# Patient Record
Sex: Female | Born: 1950 | Race: White | Hispanic: No | Marital: Married | Smoking: Never smoker
Health system: Southern US, Community
[De-identification: ages and names within clinical notes are randomized; demographics above are authoritative.]

## PROBLEM LIST (undated history)

## (undated) DIAGNOSIS — E7800 Pure hypercholesterolemia, unspecified: Secondary | ICD-10-CM

## (undated) DIAGNOSIS — I10 Essential (primary) hypertension: Secondary | ICD-10-CM

## (undated) DIAGNOSIS — K219 Gastro-esophageal reflux disease without esophagitis: Secondary | ICD-10-CM

---

## 2016-08-24 ENCOUNTER — Emergency Department (HOSPITAL_COMMUNITY): Payer: PRIVATE HEALTH INSURANCE

## 2016-08-24 ENCOUNTER — Encounter (HOSPITAL_COMMUNITY): Payer: Self-pay | Admitting: *Deleted

## 2016-08-24 ENCOUNTER — Emergency Department (HOSPITAL_COMMUNITY)
Admission: EM | Admit: 2016-08-24 | Discharge: 2016-08-24 | Disposition: A | Discharge status: Home / Self Care | Payer: PRIVATE HEALTH INSURANCE | Attending: Emergency Medicine | Admitting: Emergency Medicine

## 2016-08-24 DIAGNOSIS — I10 Essential (primary) hypertension: Secondary | ICD-10-CM | POA: Diagnosis not present

## 2016-08-24 DIAGNOSIS — Z7982 Long term (current) use of aspirin: Secondary | ICD-10-CM | POA: Diagnosis not present

## 2016-08-24 DIAGNOSIS — R079 Chest pain, unspecified: Secondary | ICD-10-CM | POA: Insufficient documentation

## 2016-08-24 DIAGNOSIS — Z79899 Other long term (current) drug therapy: Secondary | ICD-10-CM | POA: Insufficient documentation

## 2016-08-24 HISTORY — DX: Essential (primary) hypertension: I10

## 2016-08-24 HISTORY — DX: Gastro-esophageal reflux disease without esophagitis: K21.9

## 2016-08-24 HISTORY — DX: Pure hypercholesterolemia, unspecified: E78.00

## 2016-08-24 LAB — CBC
HEMATOCRIT: 42.8 % (ref 36.0–46.0)
HEMOGLOBIN: 14.8 g/dL (ref 12.0–15.0)
MCH: 33.6 pg (ref 26.0–34.0)
MCHC: 34.6 g/dL (ref 30.0–36.0)
MCV: 97.1 fL (ref 78.0–100.0)
Platelets: 308 10*3/uL (ref 150–400)
RBC: 4.41 MIL/uL (ref 3.87–5.11)
RDW: 12.2 % (ref 11.5–15.5)
WBC: 11.5 10*3/uL — AB (ref 4.0–10.5)

## 2016-08-24 LAB — BASIC METABOLIC PANEL
ANION GAP: 13 (ref 5–15)
BUN: 12 mg/dL (ref 6–20)
CO2: 29 mmol/L (ref 22–32)
Calcium: 10.7 mg/dL — ABNORMAL HIGH (ref 8.9–10.3)
Chloride: 93 mmol/L — ABNORMAL LOW (ref 101–111)
Creatinine, Ser: 0.75 mg/dL (ref 0.44–1.00)
GFR calc Af Amer: >60 mL/min (ref >60)
GLUCOSE: 115 mg/dL — AB (ref 65–99)
POTASSIUM: 3.6 mmol/L (ref 3.5–5.1)
Sodium: 135 mmol/L (ref 135–145)

## 2016-08-24 LAB — HEPATIC FUNCTION PANEL
ALBUMIN: 4 g/dL (ref 3.5–5.0)
ALT: 46 U/L (ref 14–54)
AST: 28 U/L (ref 15–41)
Alkaline Phosphatase: 104 U/L (ref 38–126)
Bilirubin, Direct: 0.2 mg/dL (ref 0.1–0.5)
Indirect Bilirubin: 0.9 mg/dL (ref 0.3–0.9)
TOTAL PROTEIN: 7.4 g/dL (ref 6.5–8.1)
Total Bilirubin: 1.1 mg/dL (ref 0.3–1.2)

## 2016-08-24 LAB — I-STAT TROPONIN, ED: Troponin i, poc: 0.01 ng/mL (ref 0.00–0.08)

## 2016-08-24 LAB — LIPASE, BLOOD: Lipase: 42 U/L (ref 11–51)

## 2016-08-24 MED ORDER — GI COCKTAIL ~~LOC~~
30.0000 mL | Freq: Once | ORAL | Status: AC
  Administered 2016-08-24: 30 mL via ORAL
  Filled 2016-08-24: qty 30

## 2016-08-24 NOTE — ED Triage Notes (Signed)
Pt reports episode today of nausea and mid chest/epigastric pain that radiates through to her back. Denies sob. Also reports fever today. Denies recent cough. ekg done at triage. No acute distress noted.

## 2016-08-24 NOTE — ED Provider Notes (Signed)
[facility]-EMERGENCY DEPT Provider Note   CSN: [id number] Arrival date & time: 08/24/16  1851     History   Chief Complaint Chief Complaint  Patient presents with  . Chest Pain  . Back Pain    HPI Amanda Neal is a 65 y.o. female.  Patient reports lower sternal pain with radiation to the back since early this morning with low-grade fever. Dyspnea, diaphoresis, nausea, cough, flulike symptoms. Past medical history includes hypertension and hypercholesterolemia. She has a positive family history for coronary artery disease. Severity of symptoms is mild to moderate. Nothing makes symptoms better or worse.      Past Medical History:  Diagnosis Date  . Acid reflux   . High cholesterol   . Hypertension     There are no active problems to display for this patient.   Past Surgical History:  Procedure Laterality Date  . CESAREAN SECTION      OB History    No data available       Home Medications    Prior to Admission medications   Medication Sig Start Date End Date Taking? Authorizing Provider  aspirin EC 81 MG tablet Take 81 mg by mouth daily.   Yes Historical Provider, MD  Calcium Carb-Cholecalciferol (CALCIUM 600-D PO) Take 600 mg by mouth daily.   Yes Historical Provider, MD  Coenzyme Q10 (COQ10) 200 MG CAPS Take 200 mg by mouth daily.   Yes Historical Provider, MD  esomeprazole (NEXIUM) 20 MG capsule Take 20 mg by mouth daily at 12 noon.   Yes Historical Provider, MD  hydrochlorothiazide (HYDRODIURIL) 25 MG tablet Take 25 mg by mouth daily. 07/04/16  Yes Historical Provider, MD  irbesartan (AVAPRO) 300 MG tablet Take 300 mg by mouth at bedtime. 07/04/16  Yes Historical Provider, MD  metoprolol succinate (TOPROL-XL) 25 MG 24 hr tablet Take 25 mg by mouth daily. 07/04/16  Yes Historical Provider, MD  Multiple Vitamin (MULTIVITAMIN WITH MINERALS) TABS tablet Take 1 tablet by mouth daily.   Yes Historical Provider, MD  Tetrahydrozoline HCl (VISINE OP) Place 1 drop into both  eyes daily.   Yes Historical Provider, MD    Family History History reviewed. No pertinent family history.  Social History Social History  Substance Use Topics  . Smoking status: Never Smoker  . Smokeless tobacco: Not on file  . Alcohol use Yes     Comment: daily cocktail     Allergies   Ivp dye [iodinated diagnostic agents] and Statins   Review of Systems Review of Systems  All other systems reviewed and are negative.    Physical Exam Updated Vital Signs BP 176/86   Pulse 69   Temp 98.6 F (37 C) (Oral)   Resp 16   Ht 4\' 10"  (1.473 m)   Wt 150 lb (68 kg)   SpO2 100%   BMI 31.35 kg/m   Physical Exam  Constitutional: She is oriented to person, place, and time. She appears well-developed and well-nourished.  HENT:  Head: Normocephalic and atraumatic.  Eyes: Conjunctivae are normal.  Neck: Neck supple.  Cardiovascular: Normal rate and regular rhythm.   Pulmonary/Chest: Effort normal and breath sounds normal.  Abdominal: Soft. Bowel sounds are normal.  Musculoskeletal: Normal range of motion.  Neurological: She is alert and oriented to person, place, and time.  Skin: Skin is warm and dry.  Psychiatric: She has a normal mood and affect. Her behavior is normal.  Nursing note and vitals reviewed.    ED Treatments / Results  Labs (all labs ordered are listed, but only abnormal results are displayed) Labs Reviewed  BASIC METABOLIC PANEL - Abnormal; Notable for the following:       Result Value   Chloride 93 (*)    Glucose, Bld 115 (*)    Calcium 10.7 (*)    All other components within normal limits  CBC - Abnormal; Notable for the following:    WBC 11.5 (*)    All other components within normal limits  HEPATIC FUNCTION PANEL  LIPASE, BLOOD  I-STAT TROPOININ, ED    EKG  EKG Interpretation  Date/Time:  Sunday August 24 2016 18:55:14 EST Ventricular Rate:  77 PR Interval:  160 QRS Duration: 80 QT Interval:  366 QTC Calculation: 414 R  Axis:   -8 Text Interpretation:  Normal sinus rhythm Possible Anterior infarct , age undetermined ST & T wave abnormality, consider lateral ischemia Abnormal ECG Confirmed by Adriana SimasOOK  MD, Makhia Vosler ([id number]) on 08/24/2016 8:06:45 PM       Radiology Dg Chest 2 View  Result Date: 08/24/2016 CLINICAL DATA:  Nausea and mid chest/epigastric pain. EXAM: CHEST  2 VIEW COMPARISON:  None. FINDINGS: Cardiomediastinal silhouette is normal. Mediastinal contours appear intact. Tortuosity and mild atherosclerotic disease of the aorta noted. There is no evidence of focal airspace consolidation, pleural effusion or pneumothorax. Osseous structures are without acute abnormality. Soft tissues are grossly normal. IMPRESSION: No active cardiopulmonary disease. Electronically Signed   By: Ted Mcalpineobrinka  Dimitrova M.D.   On: 08/24/2016 19:58    Procedures Procedures (including critical care time)  Medications Ordered in ED Medications  gi cocktail (Maalox,Lidocaine,Donnatal) (30 mLs Oral Given 08/24/16 1938)     Initial Impression / Assessment and Plan / ED Course  I have reviewed the triage vital signs and the nursing notes.  Pertinent labs & imaging results that were available during my care of the patient were reviewed by me and considered in my medical decision making (see chart for details).  Clinical Course     A shunt is hemodynamically stable. Screening tests including EKG, chest x-ray, troponin, basic labs all negative. I recommended she get cardiology follow-up at home.  Final Clinical Impressions(s) / ED Diagnoses   Final diagnoses:  Chest pain, unspecified type    New Prescriptions New Prescriptions   No medications on file     Donnetta HutchingBrian Alvera Tourigny, MD 08/24/16 [id number]

## 2016-08-24 NOTE — ED Notes (Signed)
Pt back from X-ray.  

## 2016-08-24 NOTE — ED Notes (Signed)
Patient transported to X-ray 

## 2016-08-24 NOTE — Discharge Instructions (Signed)
Screening tests including chest x-ray, EKG, troponin, chemistry panel, CBC, lipase all normal. I recommend that she see a cardiologist when you get home. I would double your reflux medicine for the time being

## 2018-05-25 IMAGING — CR DG CHEST 2V
2 series · 2 of 2 positions shown · non-contrast
Comparison: None.

CLINICAL DATA: Nausea and mid chest/epigastric pain.

EXAM:
CHEST  2 VIEW

[chest pa]
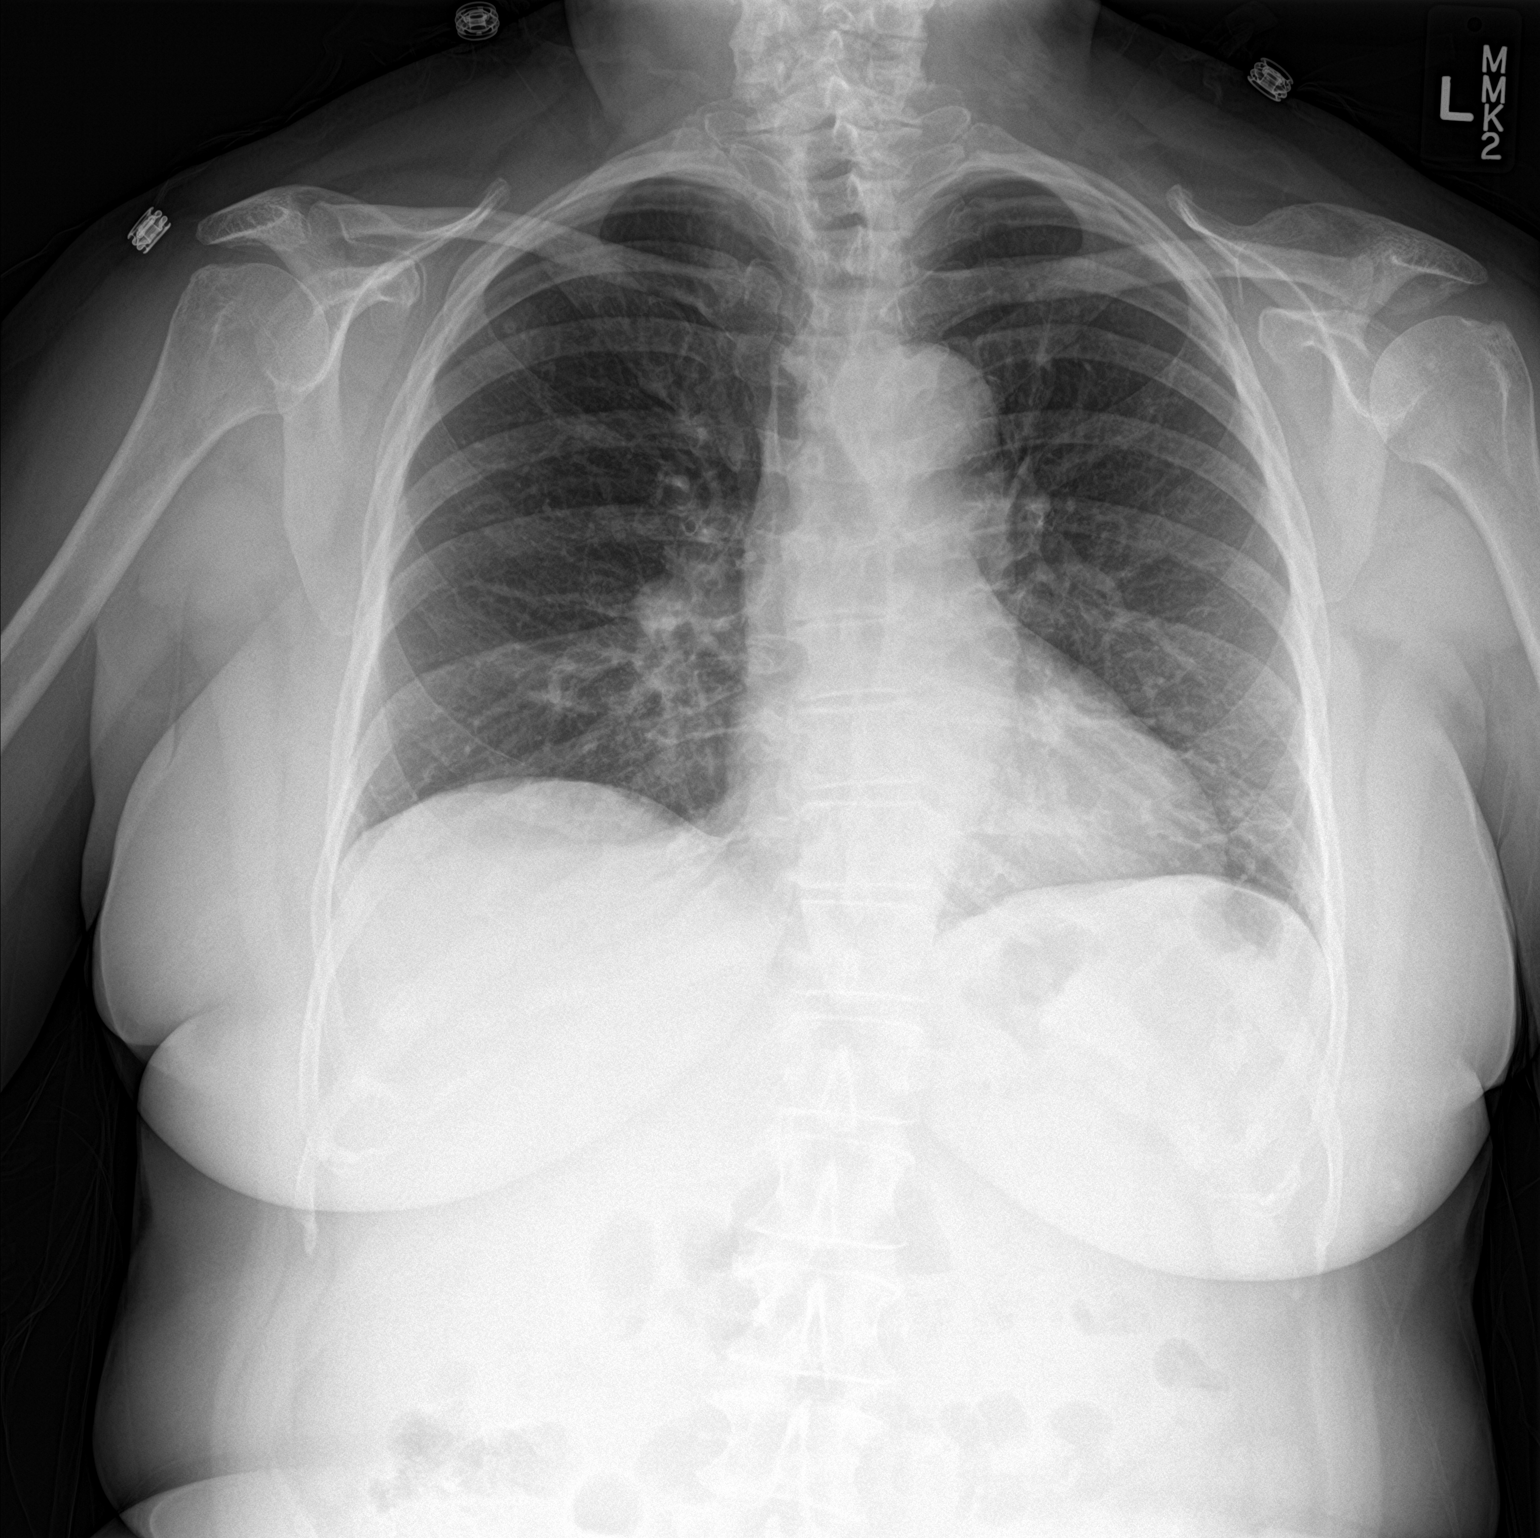

[chest lat]
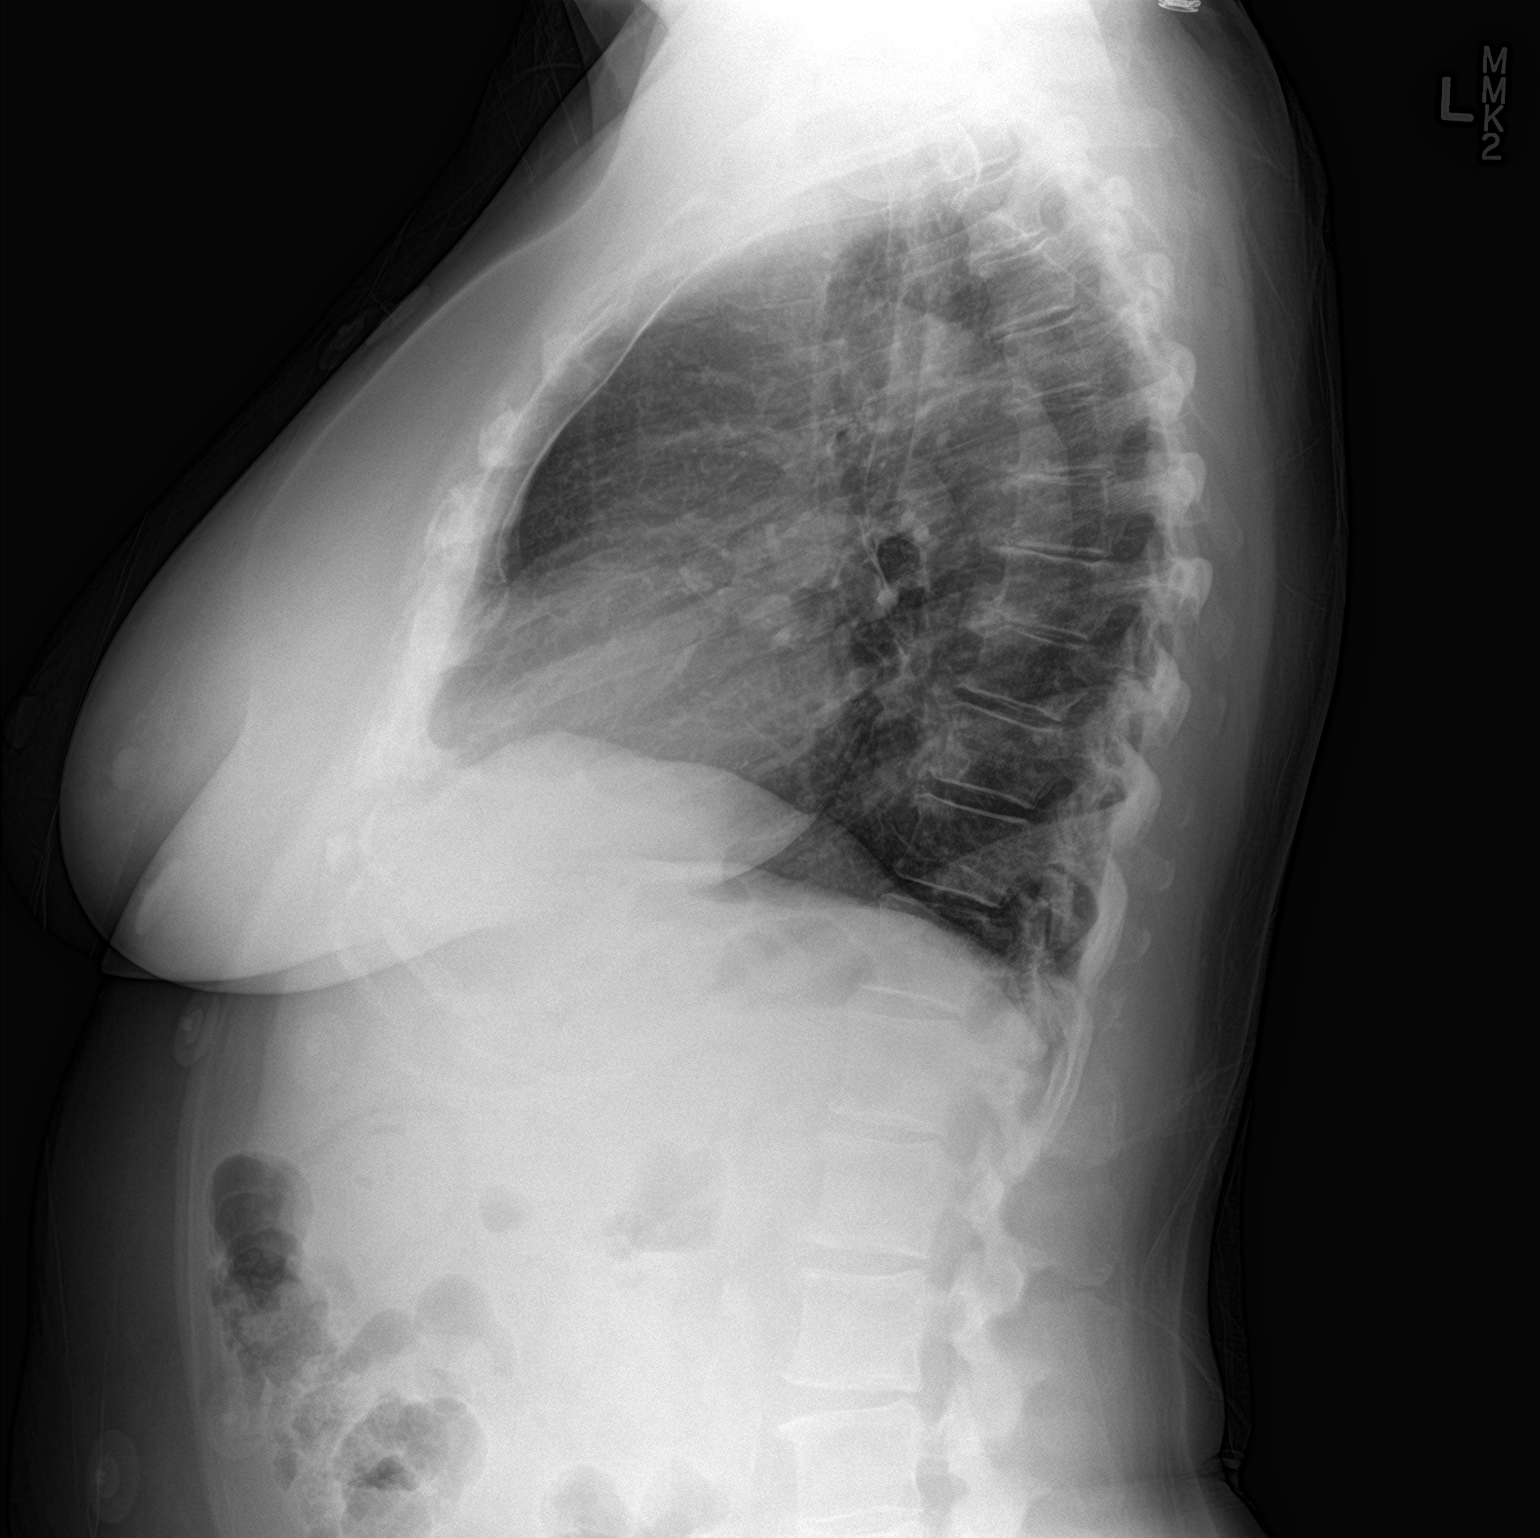

[2 of 2 positions shown; findings below may reference images not displayed]

FINDINGS: Cardiomediastinal silhouette is normal. Mediastinal contours appear
intact. Tortuosity and mild atherosclerotic disease of the aorta
noted.

There is no evidence of focal airspace consolidation, pleural
effusion or pneumothorax.

Osseous structures are without acute abnormality. Soft tissues are
grossly normal.
IMPRESSION: No active cardiopulmonary disease.
